# Patient Record
Sex: Male | Born: 1985
Health system: Southern US, Community
[De-identification: ages and names within clinical notes are randomized; demographics above are authoritative.]

---

## 2016-10-03 DIAGNOSIS — I517 Cardiomegaly: Secondary | ICD-10-CM | POA: Diagnosis not present

## 2019-08-01 ENCOUNTER — Emergency Department
Admission: EM | Admit: 2019-08-01 | Discharge: 2019-08-01 | Disposition: A | Discharge status: Home / Self Care | Payer: 59 | Attending: Emergency Medicine | Admitting: Emergency Medicine

## 2019-08-01 ENCOUNTER — Emergency Department: Payer: 59

## 2019-08-01 ENCOUNTER — Encounter: Payer: Self-pay | Admitting: Emergency Medicine

## 2019-08-01 ENCOUNTER — Other Ambulatory Visit: Payer: Self-pay

## 2019-08-01 DIAGNOSIS — Y939 Activity, unspecified: Secondary | ICD-10-CM | POA: Insufficient documentation

## 2019-08-01 DIAGNOSIS — S61411A Laceration without foreign body of right hand, initial encounter: Secondary | ICD-10-CM | POA: Diagnosis present

## 2019-08-01 DIAGNOSIS — Y999 Unspecified external cause status: Secondary | ICD-10-CM | POA: Insufficient documentation

## 2019-08-01 DIAGNOSIS — Y929 Unspecified place or not applicable: Secondary | ICD-10-CM | POA: Insufficient documentation

## 2019-08-01 DIAGNOSIS — S60221A Contusion of right hand, initial encounter: Secondary | ICD-10-CM

## 2019-08-01 DIAGNOSIS — W2209XA Striking against other stationary object, initial encounter: Secondary | ICD-10-CM | POA: Insufficient documentation

## 2019-08-01 MED ORDER — LIDOCAINE HCL (PF) 1 % IJ SOLN
10.0000 mL | Freq: Once | INTRAMUSCULAR | Status: AC
  Administered 2019-08-01: 10 mL
  Filled 2019-08-01: qty 10

## 2019-08-01 MED ORDER — HYDROCODONE-ACETAMINOPHEN 5-325 MG PO TABS
1.0000 | ORAL_TABLET | Freq: Once | ORAL | Status: AC
  Administered 2019-08-01: 21:00:00 1 via ORAL
  Filled 2019-08-01: qty 1

## 2019-08-01 NOTE — ED Provider Notes (Signed)
[facility] Provider Note ____________________________________________  Time seen: 1930  I have reviewed the triage vital signs and the nursing notes.  HISTORY  Chief Complaint  Laceration  HPI James Vance is a 34 y.o. right-handed male presents himself to the ED for evaluation of right hand pain.  Patient admits to punching a mailbox in a fit of anger.  He presents now with multiple lacerations across the fingers and knuckles.  Denies any other injury. He reports a current tetanus status.   History reviewed. No pertinent past medical history.  There are no problems to display for this patient.  History reviewed. No pertinent surgical history.  Prior to Admission medications   Not on File    Allergies Patient has no known allergies.  History reviewed. No pertinent family history.  Social History Social History   Tobacco Use  . Smoking status: Never Smoker  . Smokeless tobacco: Never Used  Substance Use Topics  . Alcohol use: Not on file  . Drug use: Not on file    Review of Systems  Constitutional: Negative for fever. Cardiovascular: Negative for chest pain. Respiratory: Negative for shortness of breath. Musculoskeletal: Negative for back pain.  Right hand pain lacerations as above. Skin: Negative for rash. Neurological: Negative for headaches, focal weakness or numbness. ____________________________________________  PHYSICAL EXAM:  VITAL SIGNS: ED Triage Vitals [08/01/19 1831]  Enc Vitals Group     BP 130/68     Pulse Rate 62     Resp 18     Temp 98.5 F (36.9 C)     Temp Source Oral     SpO2 100 %     Weight 165 lb (74.8 kg)     Height 5\' 7"  (1.702 m)     Head Circumference      Peak Flow      Pain Score 0     Pain Loc      Pain Edu?      Excl. in GC?     Constitutional: Alert and oriented. Well appearing and in no distress. Head: Normocephalic and atraumatic. Eyes: Conjunctivae are normal.  Normal extraocular movements Cardiovascular: Normal rate, regular rhythm. Normal distal pulses. Respiratory: Normal respiratory effort. No wheezes/rales/rhonchi. Musculoskeletal: right hand with multiple lacerations across the knuckles and first second and third fingers.  Normal composite fist. Nontender with normal range of motion in all extremities.  Neurologic:  Normal gait without ataxia. Normal speech and language. No gross focal neurologic deficits are appreciated. Skin:  Skin is warm, dry and intact. No rash noted. ____________________________________________   RADIOLOGY  DG Right Hand  Negative  I, Bacon-Cicley Ganesh, personally viewed and evaluated these images (plain radiographs) as part of my medical decision making, as well as reviewing the written report by the radiologist. ____________________________________________  PROCEDURES  .Smith RobertLaceration Repair  Date/Time: 08/01/2019 7:54 PM Performed by: 08/03/2019, PA-C Authorized by: Lissa Hoard, PA-C   Consent:    Consent obtained:  Verbal   Consent given by:  Patient   Risks discussed:  Pain and poor wound healing Anesthesia (see MAR for exact dosages):    Anesthesia method:  Local infiltration   Local anesthetic:  Lidocaine 1% w/o epi Laceration details:    Location:  Hand   Hand location:  R hand, dorsum   Wound length (cm): multiple lacerations (0.5 - 1 cm) Repair type:    Repair type:  Intermediate Pre-procedure details:    Preparation:  Patient  was prepped and draped in usual sterile fashion Treatment:    Area cleansed with:  Betadine and saline   Amount of cleaning:  Standard Skin repair:    Repair method:  Sutures and tissue adhesive   Suture material:  Nylon   Suture technique:  Simple interrupted   Number of sutures:  11 Approximation:    Approximation:  Loose Post-procedure details:    Dressing:  Non-adherent dressing   Patient tolerance of procedure:  Tolerated well,  no immediate complications   ____________________________________________  INITIAL IMPRESSION / ASSESSMENT AND PLAN / ED COURSE  ED evaluation of self-inflicted injury to the right dorsal hand resulting in multiple lacerations across the knuckles and fingers.  The wounds were repaired using accommodation of nylon sutures and tissue adhesive.  She is discharged with wound care instructions and a work note for 2 days.  Return precautions have been reviewed.  He will see a local urgent care or his PCP for suture removal and 10 to 12 days.  James Vance was evaluated in Emergency Department on 08/03/2019 for the symptoms described in the history of present illness. He was evaluated in the context of the global COVID-19 pandemic, which necessitated consideration that the patient might be at risk for infection with the SARS-CoV-2 virus that causes COVID-19. Institutional protocols and algorithms that pertain to the evaluation of patients at risk for COVID-19 are in a state of rapid change based on information released by regulatory bodies including the CDC and federal and state organizations. These policies and algorithms were followed during the patient's care in the ED. ____________________________________________  FINAL CLINICAL IMPRESSION(S) / ED DIAGNOSES  Final diagnoses:  Laceration of right hand without foreign body, initial encounter  Contusion of right hand, initial encounter      James Needles, PA-C 08/03/19 0100    James Drafts, MD 08/03/19 2335

## 2019-08-01 NOTE — ED Triage Notes (Signed)
Pt to triage with lacerations to right hand after punching a mailbox.  Bleeding controlled at this time.

## 2019-08-01 NOTE — ED Notes (Signed)
Computer non-functional at time of DC. Pt unable to sign for receipt of DC instructions.

## 2019-08-01 NOTE — Discharge Instructions (Addendum)
Keep your wounds clean, dry, and covered. See a local urgent care center for suture removal in 10 days. Take Tylenol or Motrin for pain.

## 2021-02-08 IMAGING — DX DG HAND COMPLETE 3+V*R*
3 series · 3 of 3 positions shown · non-contrast
Comparison: None.

CLINICAL DATA: Pain

EXAM:
RIGHT HAND - COMPLETE 3+ VIEW

[hand ap]
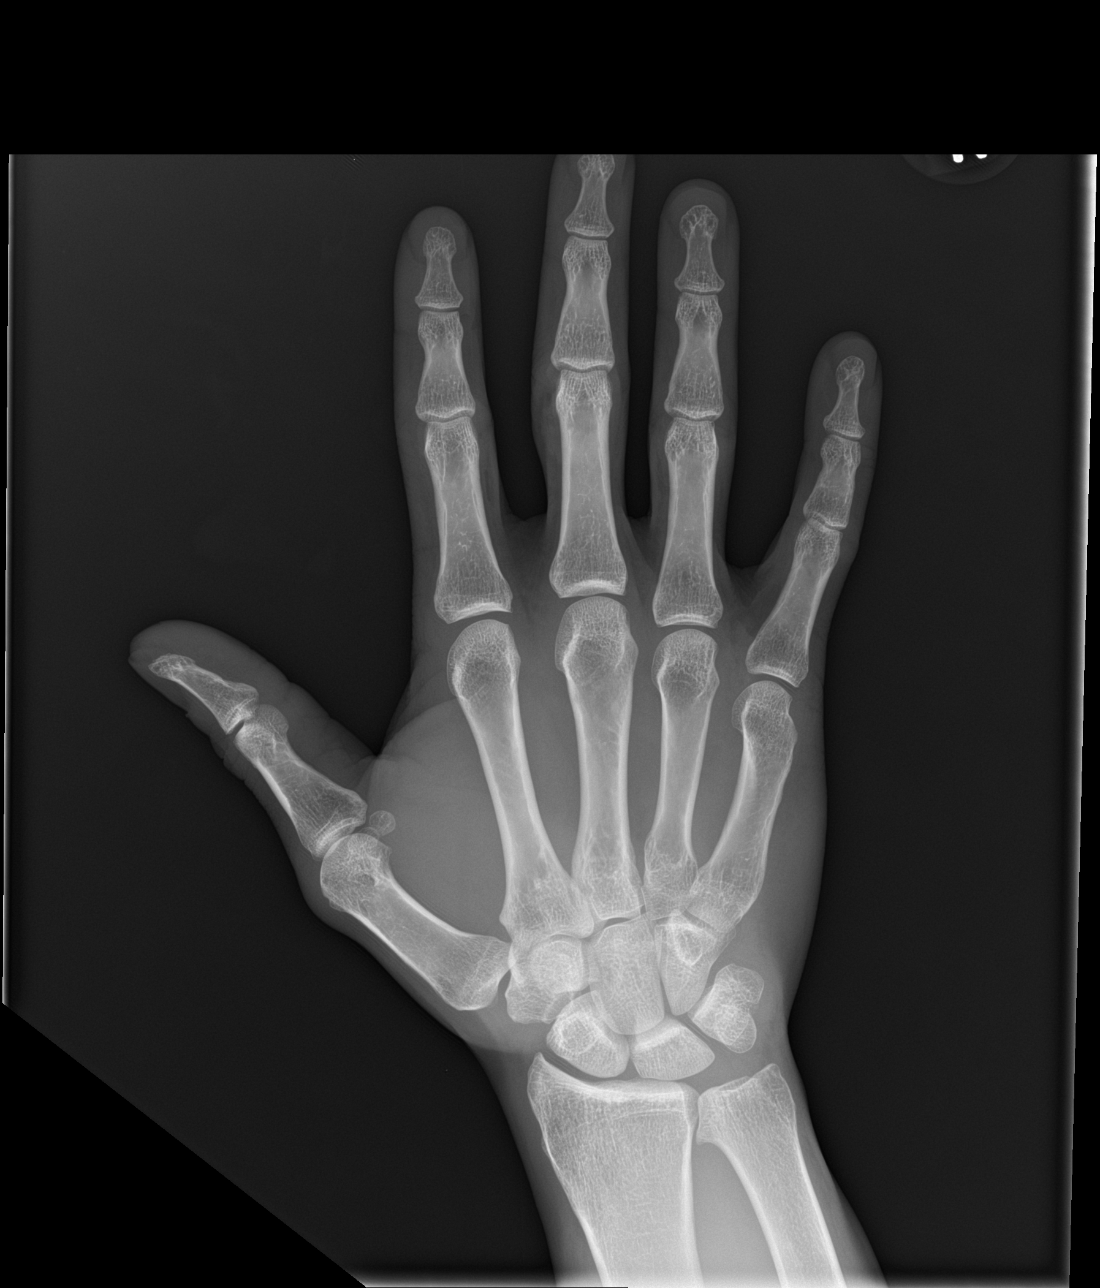

[hand obl]
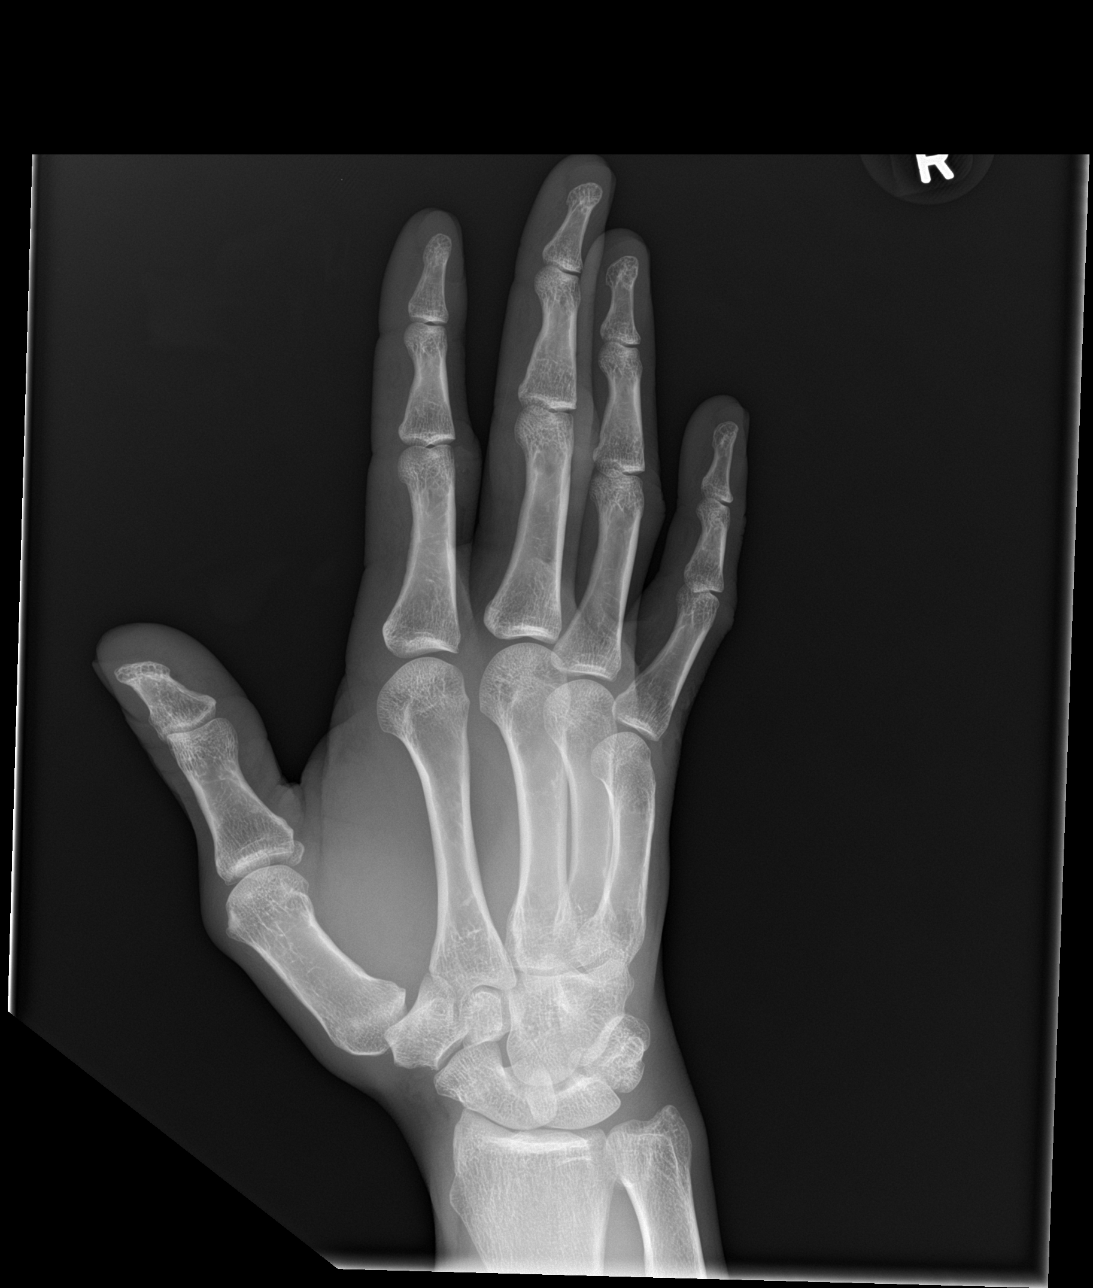

[hand lat]
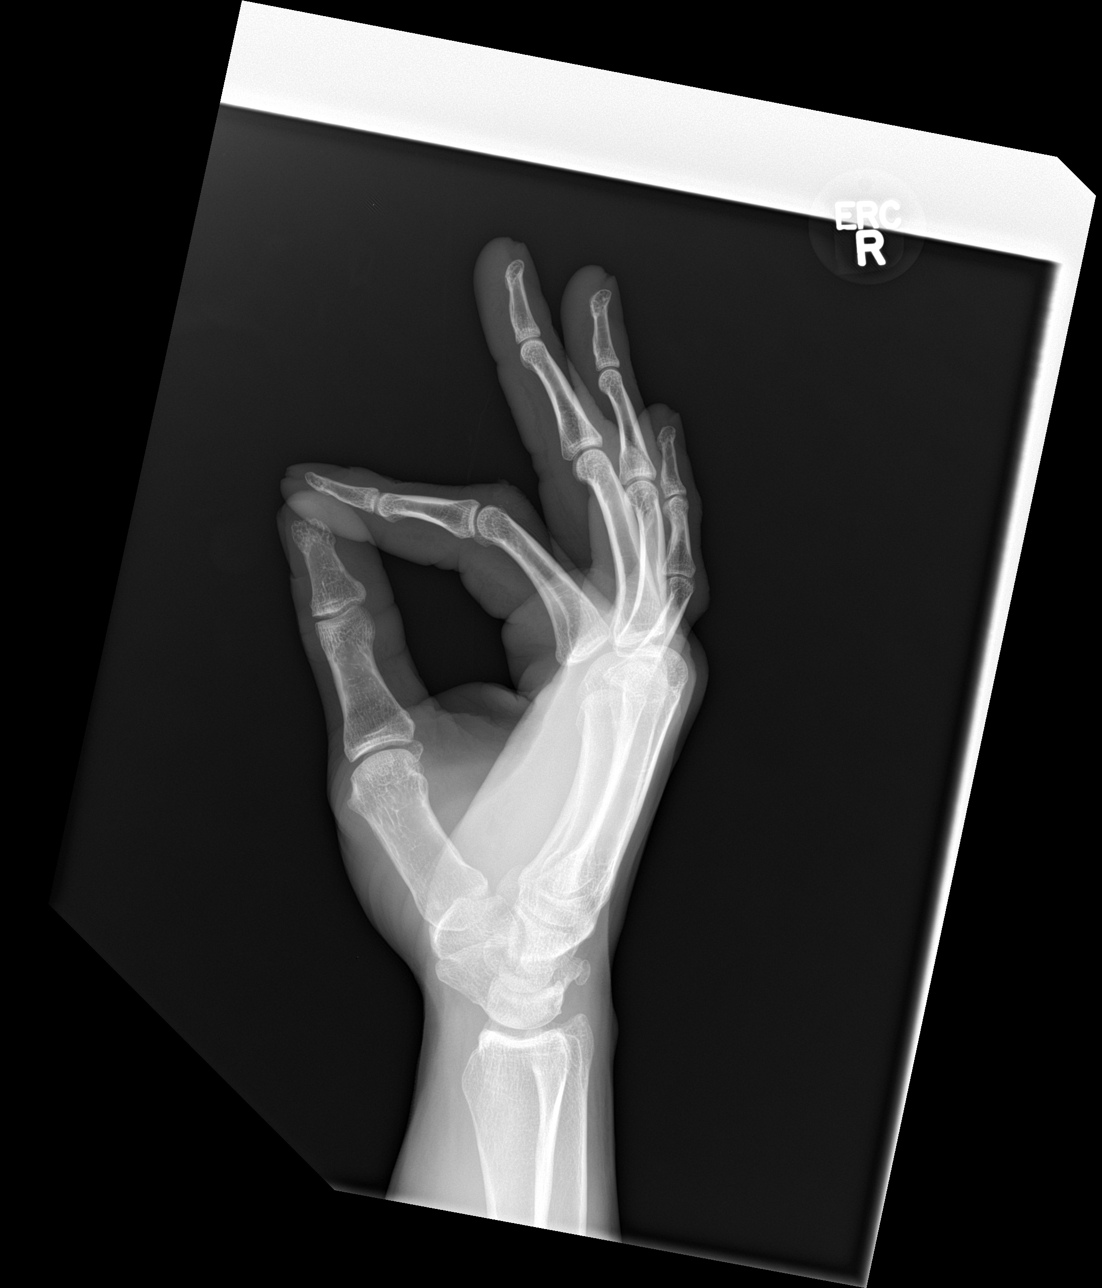

[3 of 3 positions shown; findings below may reference images not displayed]

FINDINGS: There is no evidence of fracture or dislocation. There is no
evidence of arthropathy or other focal bone abnormality. Soft
tissues are unremarkable. There is a probable old healed fracture of
the fifth metacarpal.
IMPRESSION: Negative.
